# Patient Record
Sex: Male | Born: 1970 | Race: White | Hispanic: No | Marital: Married | Smoking: Never smoker
Health system: Southern US, Community
[De-identification: ages and names within clinical notes are randomized; demographics above are authoritative.]

---

## 2014-09-02 ENCOUNTER — Inpatient Hospital Stay
Admit: 2014-09-02 | Disposition: A | Discharge status: Home / Self Care | Payer: Self-pay | Attending: Internal Medicine | Admitting: Internal Medicine

## 2014-09-02 LAB — TROPONIN I

## 2014-09-02 LAB — CBC WITH DIFFERENTIAL/PLATELET
BASOS PCT: 0.3 %
Basophil #: 0 10*3/uL (ref 0.0–0.1)
Eosinophil #: 0 10*3/uL (ref 0.0–0.7)
Eosinophil %: 0.1 %
HCT: 45.6 % (ref 40.0–52.0)
HGB: 15.5 g/dL (ref 13.0–18.0)
Lymphocyte #: 1 10*3/uL (ref 1.0–3.6)
Lymphocyte %: 7.3 %
MCH: 31.9 pg (ref 26.0–34.0)
MCHC: 34 g/dL (ref 32.0–36.0)
MCV: 94 fL (ref 80–100)
MONO ABS: 1.1 x10 3/mm — AB (ref 0.2–1.0)
MONOS PCT: 8.1 %
Neutrophil #: 11.6 10*3/uL — ABNORMAL HIGH (ref 1.4–6.5)
Neutrophil %: 84.2 %
Platelet: 246 10*3/uL (ref 150–440)
RBC: 4.86 10*6/uL (ref 4.40–5.90)
RDW: 12.9 % (ref 11.5–14.5)
WBC: 13.7 10*3/uL — ABNORMAL HIGH (ref 3.8–10.6)

## 2014-09-02 LAB — COMPREHENSIVE METABOLIC PANEL
ALBUMIN: 4.4 g/dL
ANION GAP: 8 (ref 7–16)
Alkaline Phosphatase: 74 U/L
BILIRUBIN TOTAL: 1.6 mg/dL — AB
BUN: 11 mg/dL
CREATININE: 1.07 mg/dL
Calcium, Total: 9.3 mg/dL
Chloride: 103 mmol/L
Co2: 26 mmol/L
GLUCOSE: 106 mg/dL — AB
Potassium: 3.8 mmol/L
SGOT(AST): 32 U/L
SGPT (ALT): 50 U/L
SODIUM: 137 mmol/L
TOTAL PROTEIN: 8.8 g/dL — AB

## 2014-09-02 LAB — LACTIC ACID, PLASMA: Lactic Acid, Venous: 4.9 mmol/L

## 2014-09-02 LAB — CK TOTAL AND CKMB (NOT AT ARMC)
CK, Total: 111 U/L
CK-MB: 2.5 ng/mL

## 2014-09-02 LAB — PROTIME-INR
INR: 1.2
Prothrombin Time: 15.1 secs — ABNORMAL HIGH

## 2014-09-02 LAB — APTT: Activated PTT: 27.4 secs (ref 23.6–35.9)

## 2014-09-03 LAB — CBC WITH DIFFERENTIAL/PLATELET
BASOS PCT: 0 %
Basophil #: 0 10*3/uL (ref 0.0–0.1)
EOS PCT: 0 %
Eosinophil #: 0 10*3/uL (ref 0.0–0.7)
HCT: 39.8 % — ABNORMAL LOW (ref 40.0–52.0)
HGB: 13.7 g/dL (ref 13.0–18.0)
LYMPHS PCT: 5.7 %
Lymphocyte #: 0.5 10*3/uL — ABNORMAL LOW (ref 1.0–3.6)
MCH: 32.5 pg (ref 26.0–34.0)
MCHC: 34.4 g/dL (ref 32.0–36.0)
MCV: 94 fL (ref 80–100)
MONOS PCT: 2.5 %
Monocyte #: 0.2 x10 3/mm (ref 0.2–1.0)
NEUTROS PCT: 91.8 %
Neutrophil #: 8 10*3/uL — ABNORMAL HIGH (ref 1.4–6.5)
PLATELETS: 249 10*3/uL (ref 150–440)
RBC: 4.21 10*6/uL — ABNORMAL LOW (ref 4.40–5.90)
RDW: 12.9 % (ref 11.5–14.5)
WBC: 8.7 10*3/uL (ref 3.8–10.6)

## 2014-09-03 LAB — BASIC METABOLIC PANEL
ANION GAP: 6 — AB (ref 7–16)
BUN: 15 mg/dL
Calcium, Total: 8 mg/dL — ABNORMAL LOW
Chloride: 111 mmol/L
Co2: 22 mmol/L
Creatinine: 0.98 mg/dL
EGFR (Non-African Amer.): >60
Glucose: 160 mg/dL — ABNORMAL HIGH
POTASSIUM: 4.2 mmol/L
SODIUM: 139 mmol/L

## 2014-09-07 LAB — CULTURE, BLOOD (SINGLE)

## 2014-09-07 LAB — WOUND AEROBIC CULTURE

## 2014-09-12 NOTE — Discharge Summary (Signed)
PATIENT NAMSharin Sandoval:  Austin, Sandoval MR#:  [id number] DATE OF BIRTH:  18-Sep-1970  DATE OF ADMISSION:  09/02/2014 DATE OF DISCHARGE:  09/03/2014   ADMISSION DIAGNOSES:  1. Cardiac arrest after anaphylaxis from UNASYN.  2. Anaphylactic shock from UNASYN.  3. Tonsillar abscess.   CONSULTATIONS:  1. Pulmonary.  2. Dr. Andee PolesVaught from ENT.   PHYSICAL EXAMINATION AT DISCHARGE: VITAL SIGNS: Temperature 98.1, pulse 84, respirations 20, blood pressure 105/60, 95% on room air.  GENERAL: The patient is alert and oriented, not in acute distress.  HEENT: Head is atraumatic. Pupils are round and reactive. Oropharynx is clear.  CARDIOVASCULAR: Regular rate and rhythm. No murmurs, gallops, or rubs. PMI is not displaced.  LUNGS: Clear to auscultation without crackles, rales, rhonchi, or wheezing. Normal to percussion.   EXTREMITIES: No clubbing, cyanosis, or edema.  ABDOMEN: Bowel sounds are positive. Nontender, nondistended.   LABORATORIES AT DISCHARGE: White blood cells 8, hemoglobin 13.7, hematocrit 40, platelets 249,000, sodium 139, potassium 4.2, chloride 111, bicarbonate 22, BUN 15, creatinine 0.98, glucose is 160, calcium 8.0.   Peritonsillar abscess culture is holding for possible pathogen.   CT of the neck showed left tonsillar abscess.   HOSPITAL COURSE: A 44 year old male who was seen for left tonsillar abscess in the Emergency Department. Started on UNASYN and Decadron, subsequently had a cardiac arrest after UNASYN was given. For further details, please refer to the H and P.  1. Cardiac arrest secondary to anaphylaxis from UNASYN. This is obviously on his drug list. He should avoid PENICILLINS, as well as CEPHALOSPORINS. He was initially placed on the ventilator. Pulmonary was consulted. He was quickly extubated and is doing well from a respiratory and cardiac standpoint of view.  2. Anaphylaxis from the UNASYN. Again he should avoid UNASYN, PENICILLINS, and CEPHALOSPORINS. 3.  Left tonsillar abscess.  We appreciate Dr. Gregary CromerVaught's consult. The patient was placed on Decadron. He will be sent with p.o. steroids and clindamycin.   DISCHARGE MEDICATIONS: 1. Clindamycin 300 mg p.o. t.i.d. for 12 days.  2. Prednisone taper starting at 50 mg, taper by 10 mg every 2 days.  3. Fluticasone nasal spray b.i.d.  4. Multivitamin 1 tablet daily.   DISCHARGE DIET: Regular diet.   DISCHARGE ACTIVITY: As tolerated.   DISCHARGE FOLLOWUP: The patient will follow up with Dr. Andee PolesVaught in 1 week.   TIME SPENT: 35 minutes.    ____________________________ Janyth ContesSital P. Juliene PinaMody, MD spm:mw D: 09/03/2014 12:25:44 ET T: 09/03/2014 13:15:56 ET JOB#: [id number]  cc: Patrice Moates P. Juliene PinaMody, MD, <Dictator> Kyung Ruddreighton C. Vaught, MD Janyth ContesSITAL P Nesreen Albano MD ELECTRONICALLY SIGNED 09/03/2014 14:52

## 2014-09-12 NOTE — Op Note (Signed)
PATIENT NAME:  Austin Sandoval, Austin Sandoval MR#:  [id number] DATE OF BIRTH:  09-04-1970  DATE OF PROCEDURE:  09/02/2014  PREPROCEDURE DIAGNOSIS: Left peritonsillar abscess.   POSTPROCEDURE DIAGNOSIS: Left peritonsillar abscess.    PROCEDURE PERFORMED: Incision and drainage of left peritonsillar abscess.   ANESTHESIA: Local and general anesthesia.  INTRAVENOUS FLUIDS: Please see intensive care unit record.   DRAINS AND STENT PLACEMENTS: None.   SPECIMENS: Aerobic culture swab.   COMPLICATIONS: None.   ESTIMATED BLOOD LOSS: Less than 5 mL.   INDICATIONS FOR PROCEDURE: The patient is a 44 year old male with a history of a left peritonsillar abscess, currently intubated following a code.   OPERATIVE FINDINGS: Large left-sided peritonsillar abscess successfully opened with incision and drainage, with approximately 12 mL of purulent fluid.   DESCRIPTION OF PROCEDURE: The patient was identified in the intensive care unit and consent that was obtained over the phone with the patient's wife was reviewed. The patient's oral cavity was evaluated and noted to have fluctuance and fullness just adjacent to the left tonsil. At this time, a 10 mL syringe with a 25-gauge needle and plain 1% lidocaine was injected just lateral to the superior pole of the patient's left tonsil. Using an 18-gauge seeker needle and a 10 mL syringe was inserted just lateral to the left superior tonsil. This was withdrawn with approximately 12 mL of purulent fluid. At this time, an 11 blade scalpel was used to make a vertical incision at this site and then a hemostat was used to open the incision up and further purulent fluid was expressed from the large abscess cavity adjacent to the tonsil and extending into the posterior oropharyngeal wall. At this time, care of the patient was transferred back to the intensive care unit in guarded condition.     ____________________________ Kyung Ruddreighton C. Juri Dinning, MD ccv:[id number] D: 09/02/2014 18:32:53  ET T: 09/02/2014 18:58:29 ET JOB#: [id number]  cc: Kyung Ruddreighton C. Tessica Cupo, MD, <Dictator> Kyung RuddREIGHTON C Kathyrn Warmuth MD ELECTRONICALLY SIGNED 09/09/2014 9:30

## 2014-09-12 NOTE — Consult Note (Signed)
PATIENT NAME:  Austin Sandoval, Austin Sandoval DATE OF BIRTH:  1970-12-29  DATE OF CONSULTATION:  09/02/2014  REFERRING PHYSICIAN:  Dr. Allena Sandoval CONSULTING PHYSICIAN:  Austin Ruddreighton C. Jossilyn Benda, MD  REASON FOR CONSULTATION:  Possible peritonsillar abscess.   HISTORY OF PRESENT ILLNESS:  The patient is a 44 year old gentleman whose history is obtained from the chart as well as with just talking with the wife.  He had a history of a sore throat for last five days.  He was evaluated at walk-in and swabbed for possible strep and was told that it was negative.  He continued to have a sore throat and was evaluated at another clinic in [place] and then was sent to the Emergency Room.  Upon arrival to the ER, apparently was given some Unasyn, Decadron, and morphine for concern for a peritonsillar abscess.  He had a code and respiratory arrest at that point and required CPR and intubation.  I was called for evaluation of his airway and possible peritonsillar abscess.  The patient also underwent a CT scan of the neck with contrast as well.  It is unclear what he had the allergic reaction to but he is  currently allergic to morphine and Unasyn given the severity of his reaction.   PAST MEDICAL HISTORY:  Negative.  PAST SURGICAL HISTORY:   Right tooth abscess drainage several years ago.   SOCIAL HISTORY:  He is married, lives with his wife at home.  Denies any significant alcohol or drug use.   REVIEW OF SYSTEMS:  The patient reports pain in his throat with head shake, however, all other review of systems unable to be obtained.   FAMILY HISTORY:  Very easy bleeding.   PHYSICAL EXAMINATION:   VITAL SIGNS:  Temperature is 98.5, pulse 111, respirations 18, blood pressure 127/90, pulse oximetry is 100%.    LABORATORY AND IMAGING DATA:  CBC reveals white blood count of 13.7.  CT scan is reviewed which is persistent with a possible peritonsillar abscess on the left side with hypodensity.   IMPRESSION:  Peritonsillar  abscess.      PLAN:  Proceed with drainage .  Consent is obtained from the wife and cultures will be taken and I will re-evaluate the patient tomorrow morning to see response following the procedure as well as through medications.      ____________________________ Austin Ruddreighton C. Emmaus Brandi, MD [id number] D: 09/02/2014 18:30:10 ET T: 09/02/2014 18:46:32 ET JOB#: [id number]  cc: Austin Ruddreighton C. Hyatt Capobianco, MD, <Dictator> Austin RuddREIGHTON C Charelle Petrakis MD ELECTRONICALLY SIGNED 09/09/2014 9:30

## 2014-09-12 NOTE — H&P (Signed)
PATIENT NAMEBRYNN, Austin Sandoval MR#:  [id number] DATE OF BIRTH:  1971-02-13  DATE OF ADMISSION:  09/02/2014  PRIMARY CARE PHYSICIAN:  None.   CHIEF COMPLAINT:  Cardiorespiratory arrest, status post mechanical ventilator.   HISTORY OF PRESENT ILLNESS:  History is obtained from the patient's wife and ER physician along with ER staff.  Austin Sandoval is a 44 year old Caucasian gentleman with no significant past medical history, comes to the Emergency Room after having pain in his left tonsillar area.  He had increased salivation and drooling of saliva.  He was seen at [facility] and was asked to come to the Emergency Room for evaluation.  Wife denies any fever or any difficulty swallowing; however, patient did complain of odynophagia.  In the Emergency Room, the patient was seen in the flex area where he received a dose of IV Unasyn, IV Decadron 10 mg, and IV morphine.  The patient after a few minutes coded and had an abnormal movement, and a code blue was called.  He went into asystole.  He received epinephrine.  CPR was done.  The patient was intubated, and pulse and blood pressure were obtained.  He was moved to the ER side and currently is hemodynamically stable with heart rate in the 100s.  Blood pressure is 125/60, and saturations are 98% on current ventilator settings.  He is being admitted for further evaluation and management.  A CT of the neck which was ordered as stat is still pending.   Patient immediately turned "beet red" color prior to the code.  The color of his skin improved with subcutaneous epinephrine.   PAST MEDICAL HISTORY:  Right-sided tooth abscess.  Underwent some dental workup through oral surgeon in [place].  This was several years ago.    ALLERGIES:  TO DATE, NO KNOWN DRUG ALLERGY.   PATIENT'S WIFE DOES NOT RECALL PATIENT BEING ALLERGIC TO PENICILLIN.  CURRENTLY THE PATIENT IS PLACED ON ALLERGY FOR MORPHINE AND UNASYN.   SOCIAL HISTORY:  Obtained from wife.  He is  married.  Lives with wife at home.  Nonsmoker, nonalcoholic.  No drug use.   REVIEW OF SYSTEMS:  Unobtainable.  Patient intubated on the ventilator.    FAMILY HISTORY:  Per wife, hypertension.   PHYSICAL EXAMINATION:  GENERAL:  The patient is currently intubated on the ventilator.   VITAL SIGNS:  Temperature is 98.6.  Pulse is 109, respirations 16.  Blood pressure is 119/71.  Saturations are 100% on current ventilator setting.  HEENT:  Atraumatic, normocephalic.  PERLA.  EOM intact.  Oral mucosa moist.  NECK:  Supple.  No JVD.  No carotid bruit.  RESPIRATORY:  Clear to auscultation bilaterally.  No rales, rhonchi, respiratory distress, or labored breathing.  CARDIOVASCULAR:  Tachycardia present.  No murmur heard.  PMI not lateralized.  ABDOMEN:  Soft.  No organomegaly.  Positive bowel sounds.  NEUROLOGIC:  Unable to assess.  Patient on the ventilator.  SKIN:  Patient has generalized pink coloration.  Initially his skin was beet red.    LABORATORY DATA:  PH is 7.23, pCO2 of 38, pO2 of 344, bicarbonate of 15.9.  Chest x-ray:  Endotracheal tube in satisfactory position, mild chronic interstitial lung disease.  White count is 13.7.  Hematocirt 45.6. Platelet count is 246,000.  Glucose is 106.  BUN is 11.  Creatinine is 1.07.  Sodium 137. Potassium is 3.8.  Chloride is 103.  Bicarbonate is 26.  Bilirubin is 1.6.    EKG shows sinus tachycardia.  ASSESSMENT AND PLAN:  Austin Sandoval is a 44 year old with no significant past medical history; comes to the Emergency Room with odynophagia and left tonsillar pain and swelling; is being admitted with:  1. Acute cardiorespiratory arrest.  Suspected could be due to anaphylaxis to Unasyn.  The patient received IV Unasyn, IV Decadron, and IV morphine prior to his cardiorespiratory arrest.  He is currently on the ventilator.  Will admit him to the intensive care unit.  Case was discussed with Dr. Belia HemanKasa who will see the patient in consultation.  2. Will continue IV  propofol, IV fluids.  Consider IV epinephrine drip if needed.  3. IV Decadron 10 mg q. 6 hourly along with IV Benadryl 25 mg t.i.d.  4. Left tonsillar abscess/swelling with symptoms of odynophagia.  Case was discussed with Dr. Andee PolesVaught of ears, nose and throat, who will see the patient in consultation.  Will continue IV Decadron along with add IV clindamycin for antibiotic.  Follow blood cultures.  Discuss with Dr. Andee PolesVaught for airway evaluation with flexible laryngoscope.  Await his recommendations.  5. Deep vein thrombosis prophylaxis.  Subcutaneous heparin t.i.d.  6. Gastrointestinal prophylaxis.  IV Zantac b.i.d.   Above was discussed with the patient's wife who was present in the Emergency Room; questions answered.  Case was also discussed with Dr. Andee PolesVaught and Dr. Belia HemanKasa.    CRITICAL TIME SPENT:  Sixty minutes.    ____________________________ Wylie HailSona A. Allena KatzPatel, MD sap:kc D: 09/02/2014 15:46:28 ET T: 09/02/2014 15:58:48 ET JOB#: [id number]  cc: Laycee Fitzsimmons A. Allena KatzPatel, MD, <Dictator> Willow OraSONA A Euel Castile MD ELECTRONICALLY SIGNED 09/10/2014 15:14

## 2014-09-12 NOTE — Consult Note (Signed)
Brief Consult Note: Diagnosis: Left peritonsillar abcess.   Patient was seen by consultant.   Consult note dictated.   Recommend further assessment or treatment.   Comments: Left peritonsillar abcess on CT scan and exam.  Underwent incision and drainage with approximately 12 cc's of purulence from large abcess cavity.  Sent specimen for gram stain/culture.  Will re-evaluate in a.m. but due to amount of purulence removed, should make significant improvement in airway allowing for possible extubation tomorrow.  Electronic Signatures: Johnna Bollier, Rayfield Citizenreighton Charles (MD)  (Signed 21-Apr-16 18:38)  Authored: Brief Consult Note   Last Updated: 21-Apr-16 18:38 by Flossie DibbleVaught, Eligh Rybacki Charles (MD)

## 2014-09-12 NOTE — Consult Note (Signed)
Chief Complaint:  Subjective/Chief Complaint Patient reports improved pain with nodding head.  responsive to commands.   Brief Assessment:  GEN well developed, well nourished, sedated but responsive to commands and questions with motions of head and hands   Respiratory normal resp effort   Additional Physical Exam OC/OP- ETT and OG in place and secure.  incision and drainage site with reduced erythema and edema.  uvula more midline than last p.m.  Decreased tonsillar asymmetry. Neck- decreased fullness on left level 1/2.  Decreased tenderness to palpation per patient   Assessment/Plan:  Assessment/Plan:  Assessment s/p incision and drainage of PTA   Plan 1)  Improved edema and oral airway from ENT perspective.  [facility], but PTA should not pose a problem at this point.   Electronic Signatures: Brandell Maready, Rayfield Citizenreighton Charles (MD)  (Signed 22-Apr-16 07:43)  Authored: Chief Complaint, Brief Assessment, Assessment/Plan   Last Updated: 22-Apr-16 07:43 by Flossie DibbleVaught, Brenin Heidelberger Charles (MD)

## 2016-07-09 IMAGING — CR DG CHEST 1V PORT
1 series · 1 of 1 positions shown · non-contrast
Comparison: None.

CLINICAL DATA: Intubated.

EXAM:
PORTABLE CHEST - 1 VIEW

[ap]
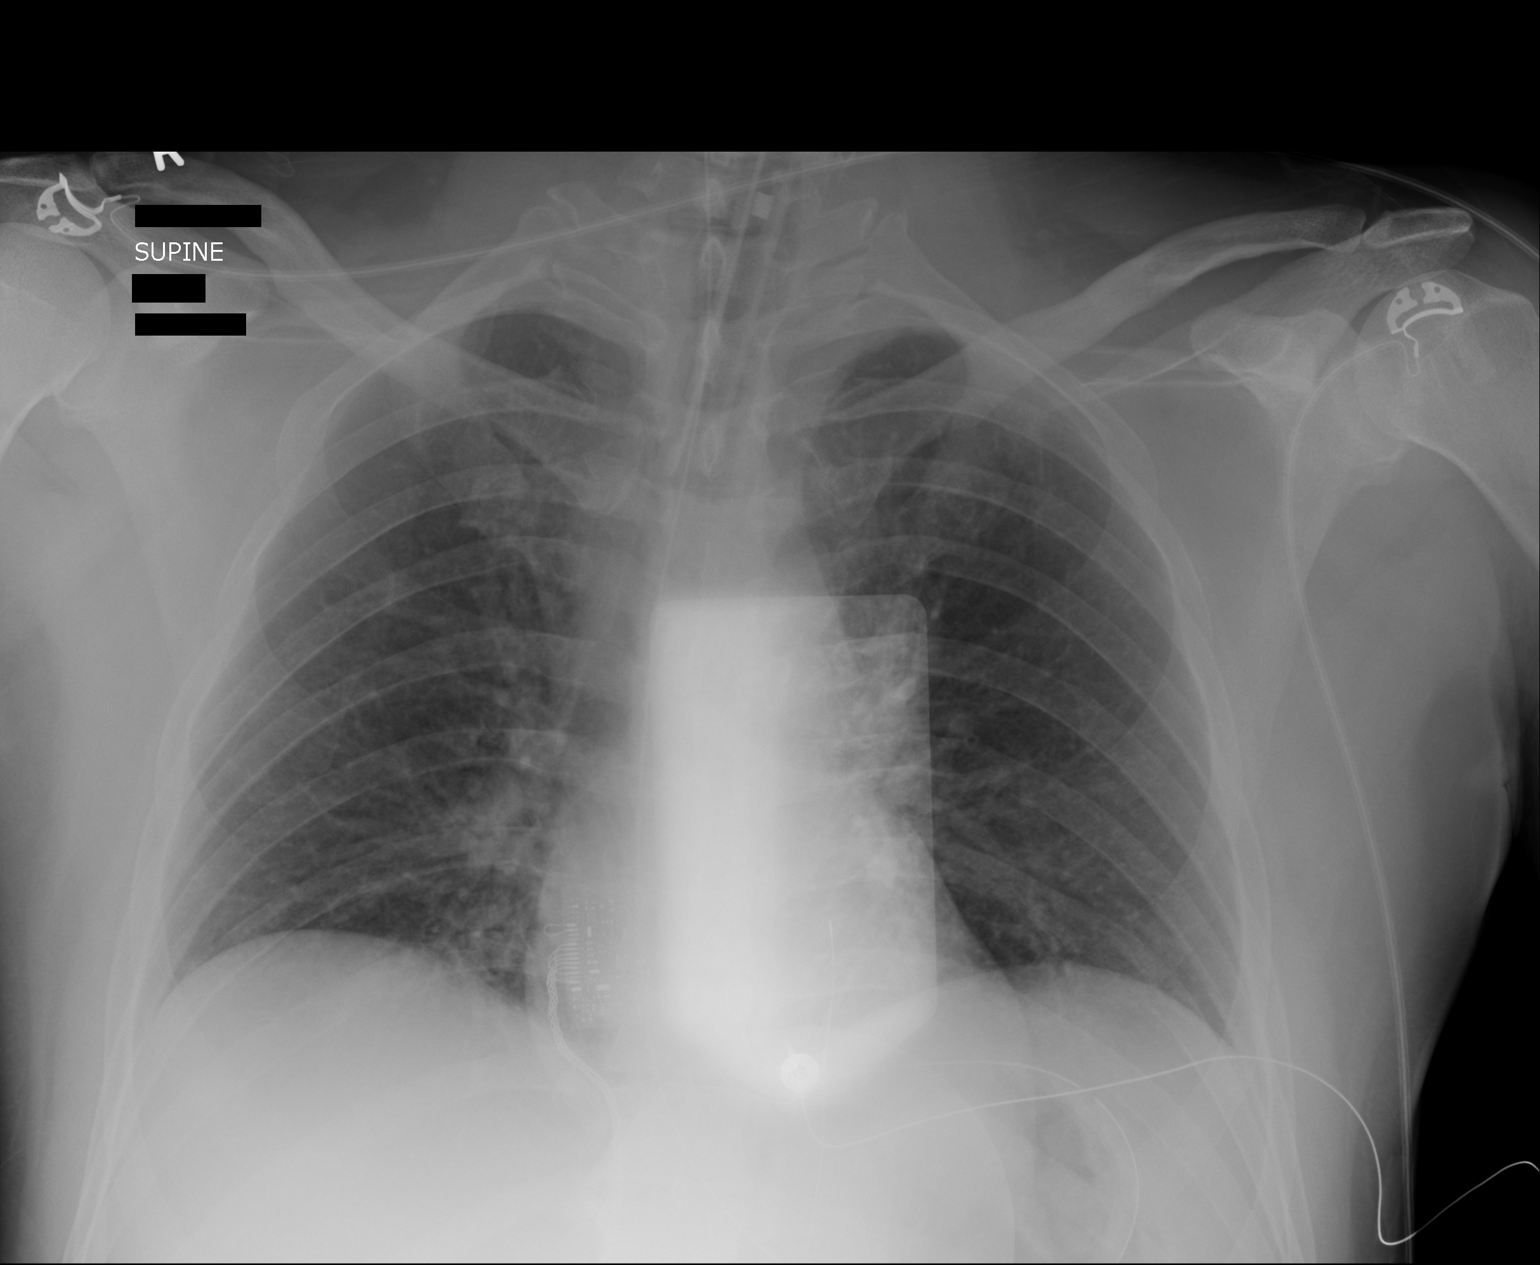

[1 of 1 positions shown; findings below may reference images not displayed]

FINDINGS: Endotracheal tube in satisfactory position. Nasogastric tube
extending into the stomach. Epicardial pacer leads. Normal sized
heart. Mildly prominent interstitial markings. Mild bilateral
shoulder degenerative changes.
IMPRESSION: 1. Endotracheal tube in satisfactory position.
2. Mild chronic interstitial lung disease.

## 2018-02-12 ENCOUNTER — Other Ambulatory Visit: Payer: Self-pay | Admitting: Podiatry

## 2018-02-12 ENCOUNTER — Encounter: Payer: Self-pay | Admitting: Podiatry

## 2018-02-12 ENCOUNTER — Ambulatory Visit: Payer: BLUE CROSS/BLUE SHIELD | Admitting: Podiatry

## 2018-02-12 ENCOUNTER — Ambulatory Visit (INDEPENDENT_AMBULATORY_CARE_PROVIDER_SITE_OTHER): Payer: BLUE CROSS/BLUE SHIELD

## 2018-02-12 VITALS — BP 120/90 | HR 76

## 2018-02-12 DIAGNOSIS — M7752 Other enthesopathy of left foot: Secondary | ICD-10-CM

## 2018-02-12 DIAGNOSIS — M779 Enthesopathy, unspecified: Principal | ICD-10-CM

## 2018-02-12 DIAGNOSIS — M778 Other enthesopathies, not elsewhere classified: Secondary | ICD-10-CM

## 2018-02-12 DIAGNOSIS — M7662 Achilles tendinitis, left leg: Secondary | ICD-10-CM

## 2018-02-12 DIAGNOSIS — M7750 Other enthesopathy of unspecified foot: Secondary | ICD-10-CM

## 2018-02-12 MED ORDER — MELOXICAM 15 MG PO TABS: 15.0000 mg | ORAL_TABLET | Freq: Every day | ORAL | 3 refills | Status: AC

## 2018-02-12 MED ORDER — METHYLPREDNISOLONE 4 MG PO TBPK: ORAL_TABLET | ORAL | 0 refills | Status: AC

## 2018-02-12 NOTE — Progress Notes (Signed)
  Subjective:  Patient ID: Austin Sandoval, male    DOB: 1971-04-09,  MRN: [id number] HPI Chief Complaint  Patient presents with  . Foot Pain    left foot back of heel; pt stated, "have had this pain before about 37yrs ago, comes and goes, but came back about a week ago; mainly after being on feet for a long period of time"    47 y.o. male presents with the above complaint.   ROS: Denies fever chills nausea vomiting muscle aches pains calf pain back pain chest pain shortness of breath.  History reviewed. No pertinent past medical history. History reviewed. No pertinent surgical history.  Current Outpatient Medications:  .  meloxicam (MOBIC) 15 MG tablet, Take 1 tablet (15 mg total) by mouth daily., Disp: 30 tablet, Rfl: 3 .  methylPREDNISolone (MEDROL DOSEPAK) 4 MG TBPK tablet, 6 day dose pack - take as directed, Disp: 21 tablet, Rfl: 0  Allergies  Allergen Reactions  . Cephalosporins Anaphylaxis  . Morphine Anaphylaxis  . Penicillins Anaphylaxis and Other (See Comments)    Cardiac arrest    Review of Systems Objective:   Vitals:   02/12/18 1107  BP: 120/90  Pulse: 76    General: Well developed, nourished, in no acute distress, alert and oriented x3   Dermatological: Skin is warm, dry and supple bilateral. Nails x 10 are well maintained; remaining integument appears unremarkable at this time. There are no open sores, no preulcerative lesions, no rash or signs of infection present.  Vascular: Dorsalis Pedis artery and Posterior Tibial artery pedal pulses are 2/4 bilateral with immedate capillary fill time. Pedal hair growth present. No varicosities and no lower extremity edema present bilateral.   Neruologic: Grossly intact via light touch bilateral. Vibratory intact via tuning fork bilateral. Protective threshold with Semmes Wienstein monofilament intact to all pedal sites bilateral. Patellar and Achilles deep tendon reflexes 2+ bilateral. No Babinski or clonus noted bilateral.     Musculoskeletal: No gross boney pedal deformities bilateral. No pain, crepitus, or limitation noted with foot and ankle range of motion bilateral. Muscular strength 5/5 in all groups tested bilateral.  He has pain on palpation of the Achilles at its insertion site of the posterior calcaneus left.  There is no gastroc equinus.  There is no purulence no malodor.  Gait: Unassisted, Nonantalgic.    Radiographs:  Radiographs taken today demonstrate a retrocalcaneal heel spur and soft tissue increase in density of the Achilles tendon as it inserts on the posterior aspect of the heel.  Not a lot of other soft tissue swelling.  Assessment & Plan:   Assessment: Insertional Achilles tendinitis left.  Plan: Discussed etiology pathology conservative versus surgical therapies at this point I injected 2 mg of dexamethasone and local anesthetic subcutaneously above the area that seems to be bothering him the most.  Making sure not to inject into the tendon.  Tolerated procedure well.  I also started him on a Medrol Dosepak to be followed by meloxicam.  Also placed him in a night boot.  I will follow-up with him in about 4 to 6 weeks at which time we may need to consider orthotics which were discussed today.     Max T. [place], [place]

## 2018-03-10 ENCOUNTER — Ambulatory Visit: Payer: BLUE CROSS/BLUE SHIELD | Admitting: Podiatry
